# Patient Record
Sex: Female | Born: 1980 | Race: White | Hispanic: No | Marital: Married
Health system: Southern US, Community
[De-identification: ages and names within clinical notes are randomized; demographics above are authoritative.]

---

## 2010-11-30 ENCOUNTER — Ambulatory Visit: Payer: Self-pay | Admitting: Internal Medicine

## 2011-05-22 ENCOUNTER — Emergency Department: Payer: Self-pay | Admitting: Emergency Medicine

## 2012-03-12 IMAGING — CT CT ABD-PELV W/ CM
1 of 2 series · 15 of 32 positions shown, 19 images · non-contrast
Comparison: none

REASON FOR EXAM: (1) abd pain; (2) abd pain  h/o hysterctomy
COMMENTS:

[Series 2: 3mm soft tissue · axial · 0.69mm/px · z∈[-986,-569]mm · 15 of 153 slices shown, 19 images]
[im 7/153  soft-tissue]
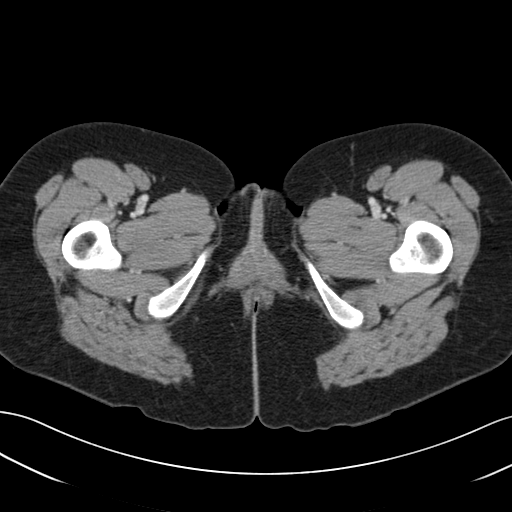
[im 7/153  bone]
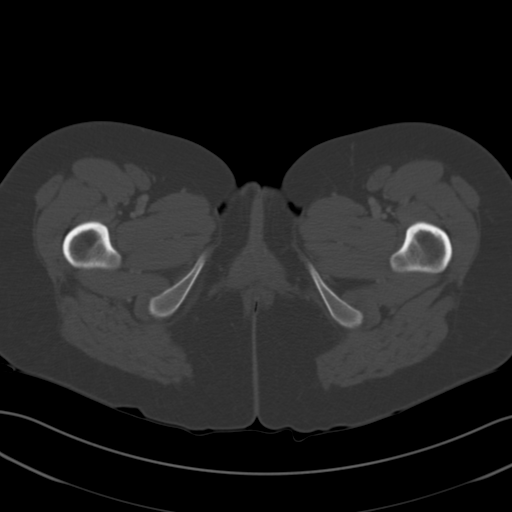
[im 20/153  soft-tissue]
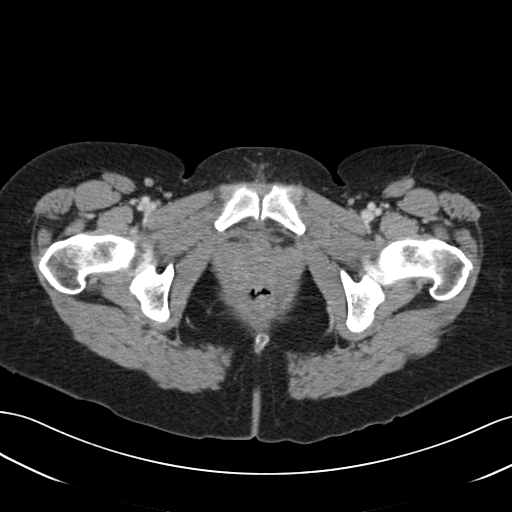
[im 32/153  soft-tissue]
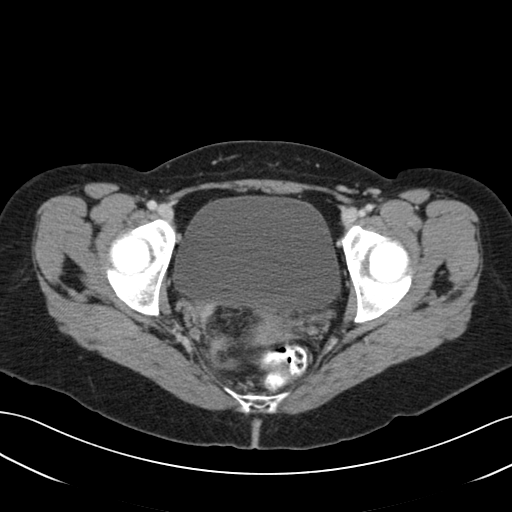
[im 45/153  soft-tissue]
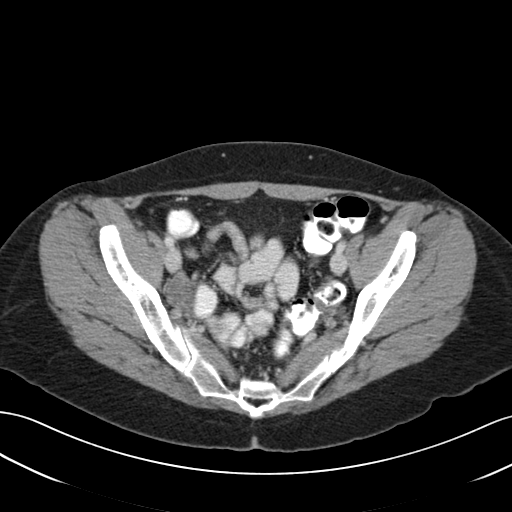
[im 51/153  soft-tissue]
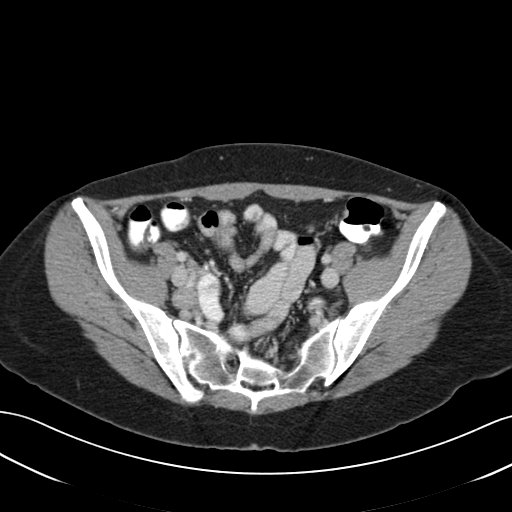
[im 64/153  soft-tissue]
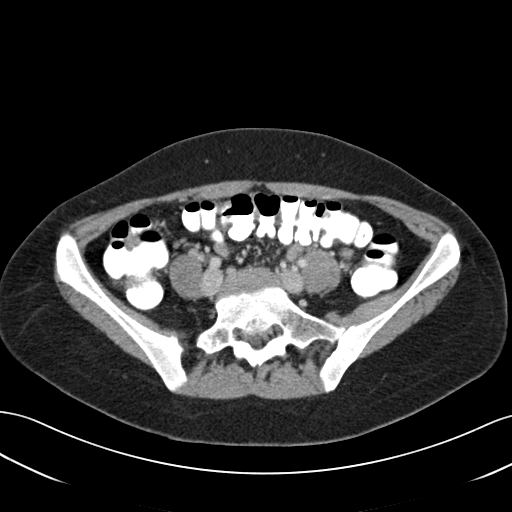
[im 77/153  soft-tissue]
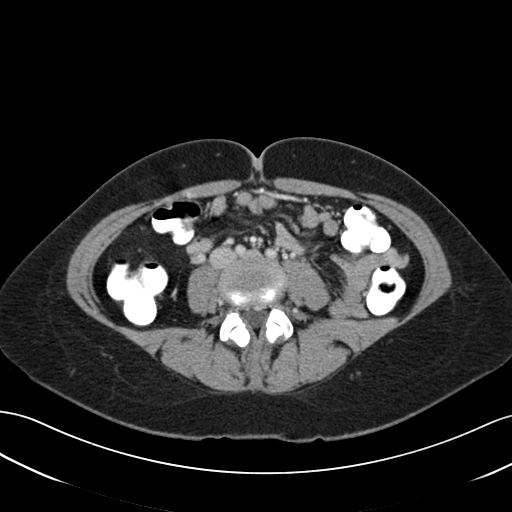
[im 89/153  soft-tissue]
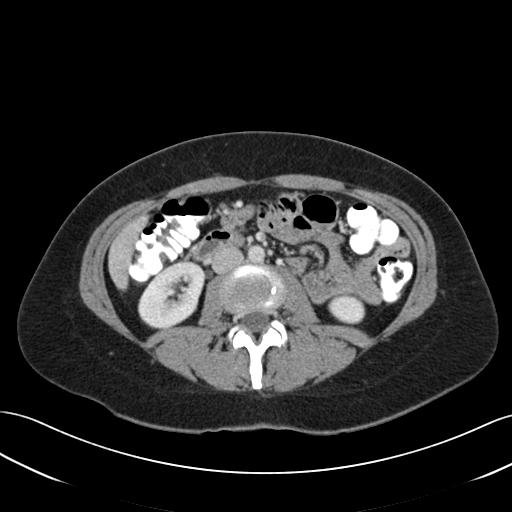
[im 102/153  soft-tissue]
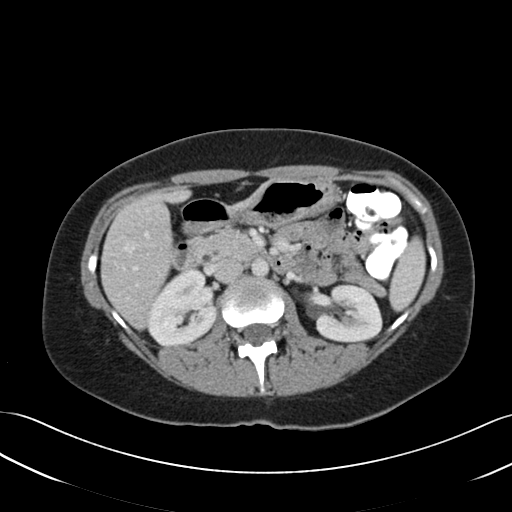
[im 102/153  bone]
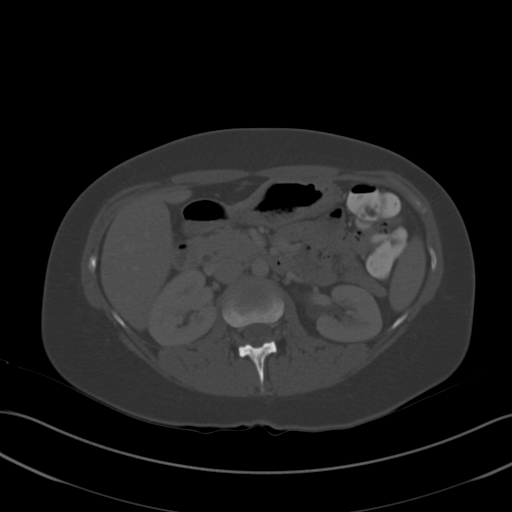
[im 108/153  soft-tissue]
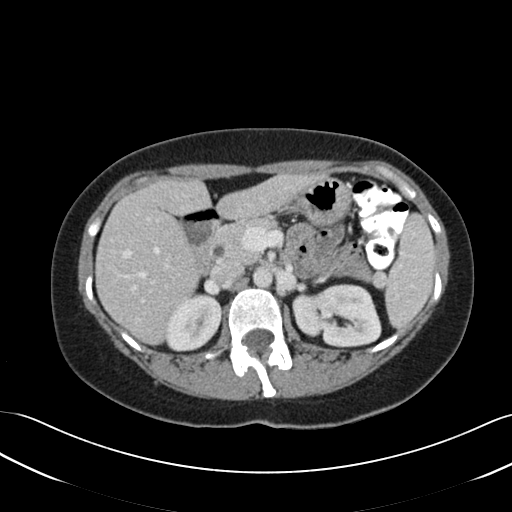
[im 121/153  soft-tissue]
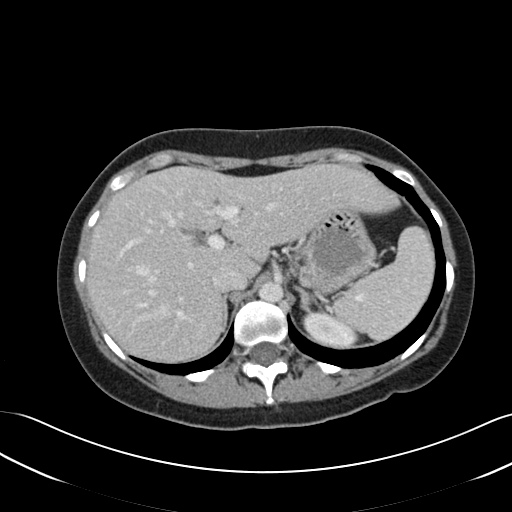
[im 127/153  lung]
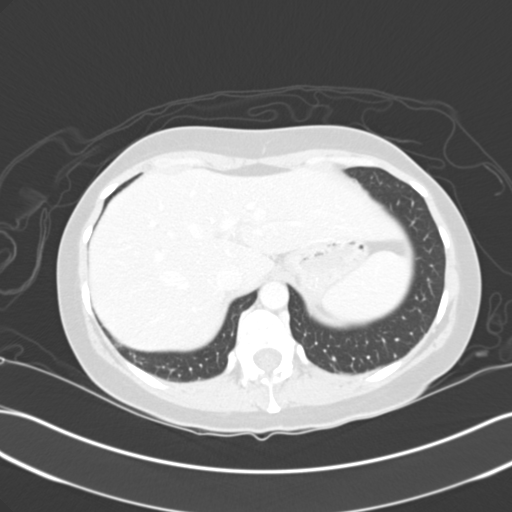
[im 134/153  soft-tissue]
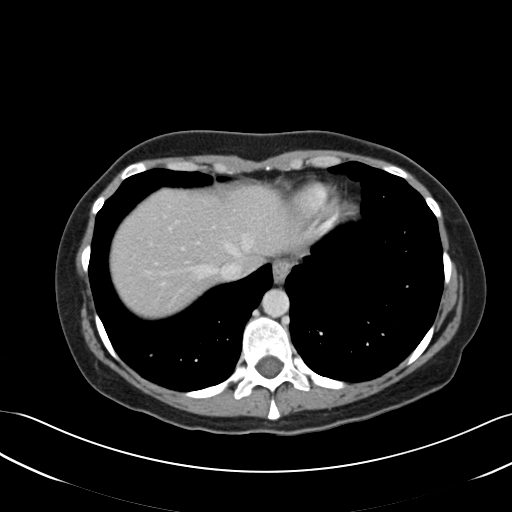
[im 134/153  lung]
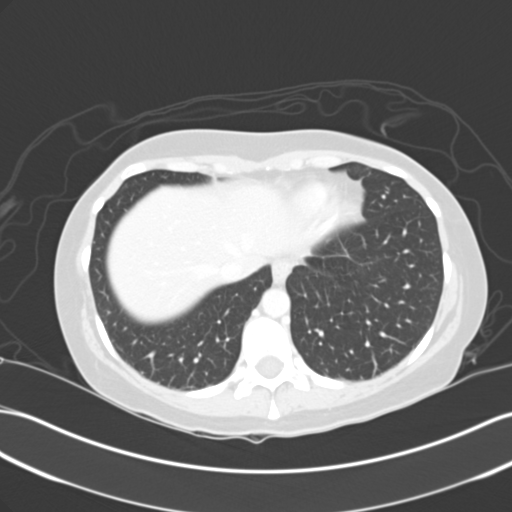
[im 140/153  lung]
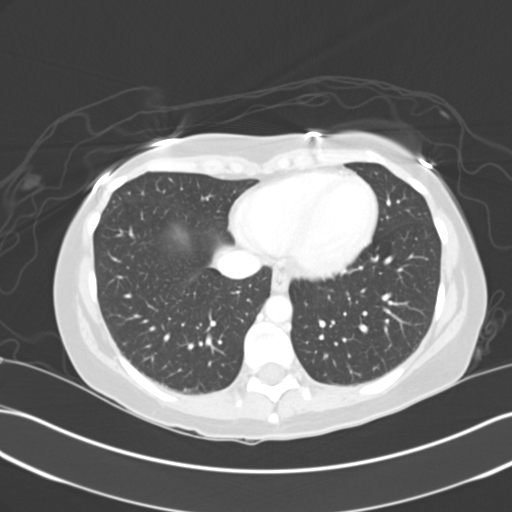
[im 146/153  soft-tissue]
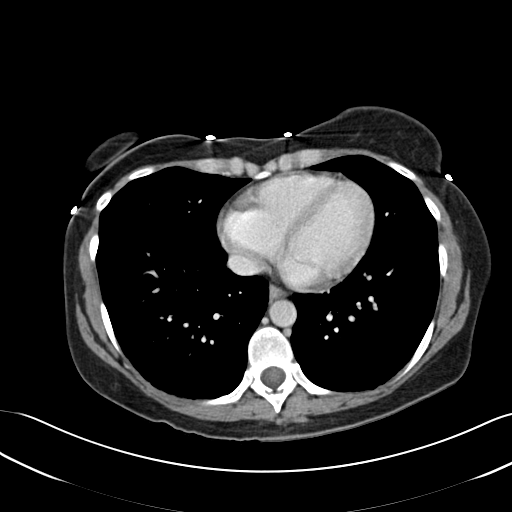
[im 146/153  lung]
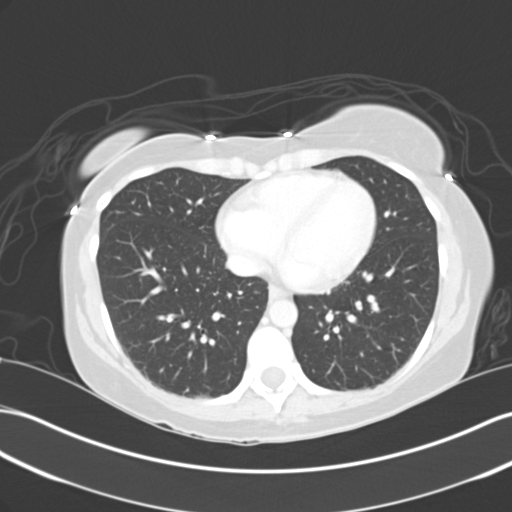

[15 of 32 positions shown; findings below may reference images not displayed]

PROCEDURE:     CT  - CT ABDOMEN / PELVIS  W  - May 22, 2011  [DATE]

RESULT:     CT of the abdomen and pelvis is performed with 85 mL of
[id number] iodinated intravenous contrast. Images are reconstructed at
mm slice thickness in the axial plane. There is no previous exam for
comparison.

Images through the base of the lungs demonstrate normal appearing aeration.
No pleural or pericardial effusion is evident. Cholecystectomy clips are
present. The liver, spleen, pancreas, kidneys and aorta appear unremarkable.
The adrenal glands show no definite mass or diffuse enlargement. The oral
contrast has passed into the colon to the rectum without evidence of
obstruction. A small amount remains in the distal small bowel. No adenopathy
is appreciated. No acute inflammation is evident area the appendix is
normal. Urinary bladder appears within normal limits. No adnexal mass is
appreciated.
IMPRESSION: 1. No acute abdominal or pelvic abnormality evident.

## 2014-03-18 ENCOUNTER — Ambulatory Visit: Payer: Self-pay | Admitting: Internal Medicine

## 2015-01-07 IMAGING — CR RIGHT FOOT COMPLETE - 3+ VIEW
1 series · 4 of 4 positions shown · non-contrast
Comparison: None.

CLINICAL DATA: Pain post trauma

EXAM:
RIGHT FOOT COMPLETE - 3+ VIEW

[Series 1: ap · 0.17mm/px · 4 of 4 slices shown]
[im 1/4]
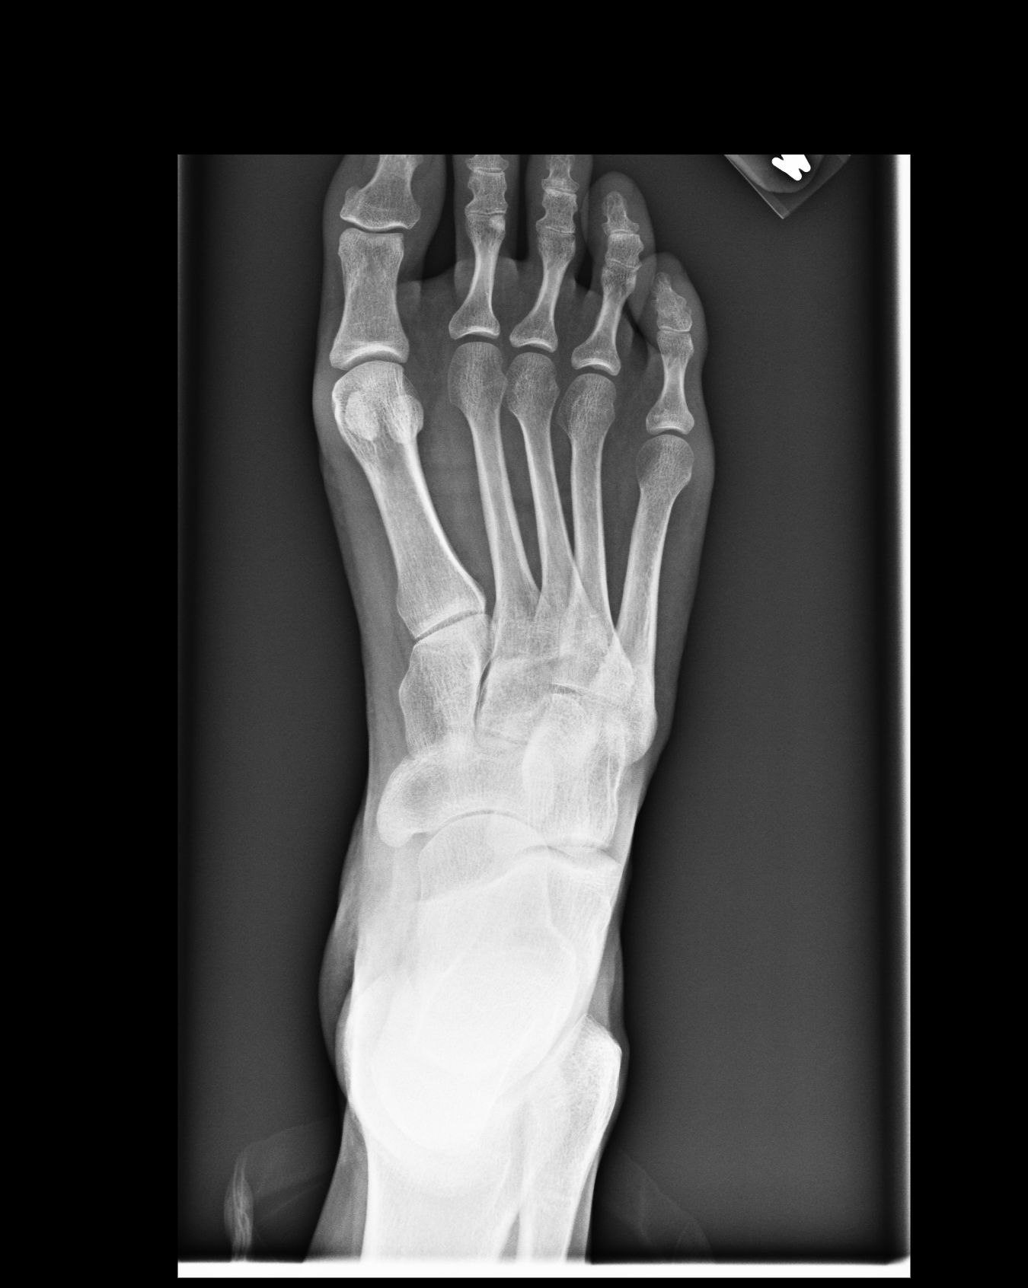
[im 2/4]
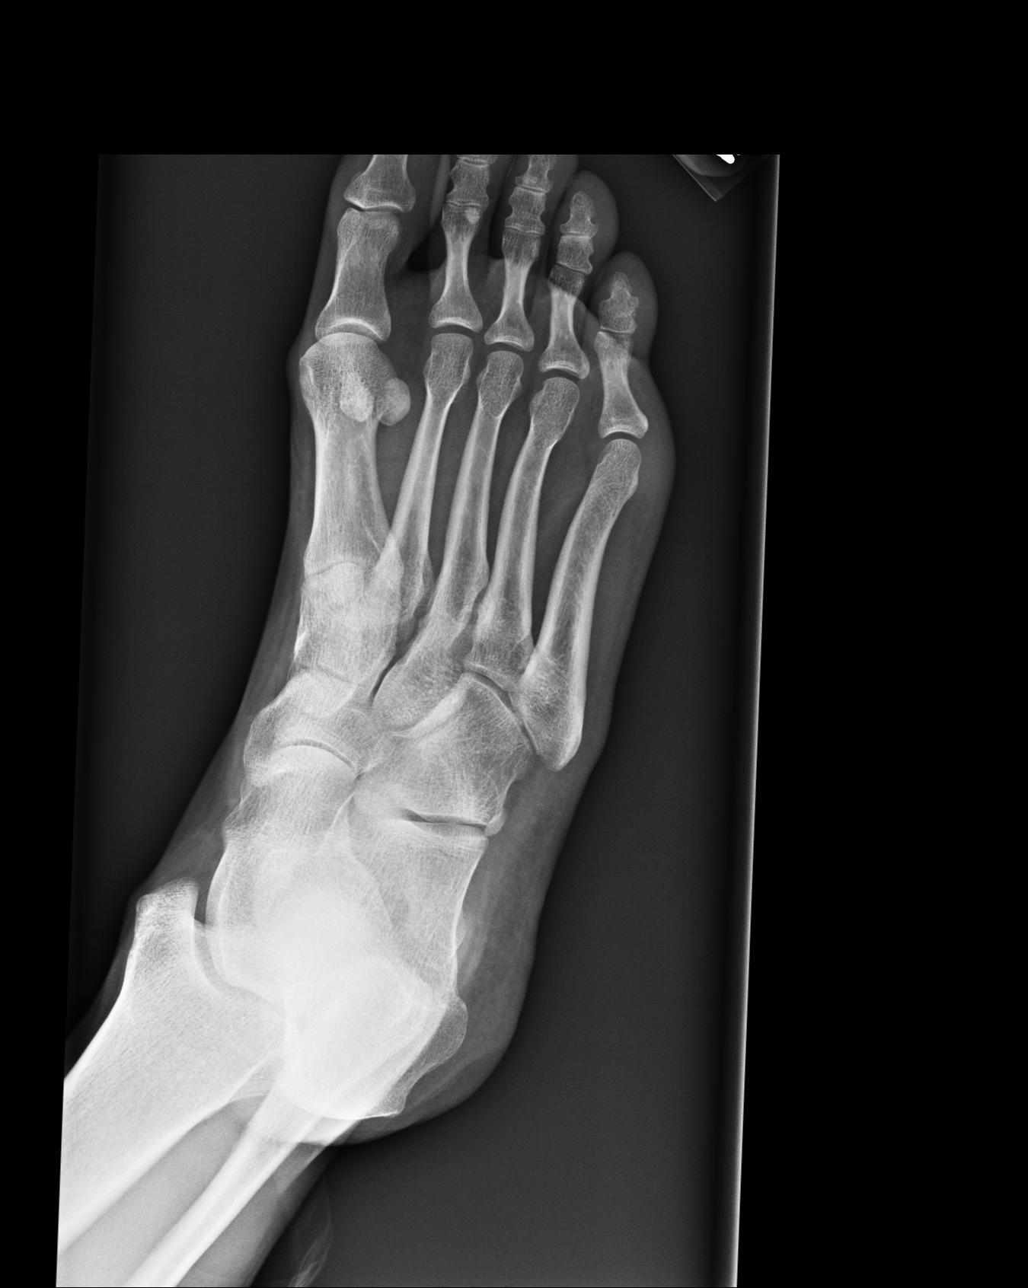
[im 3/4]
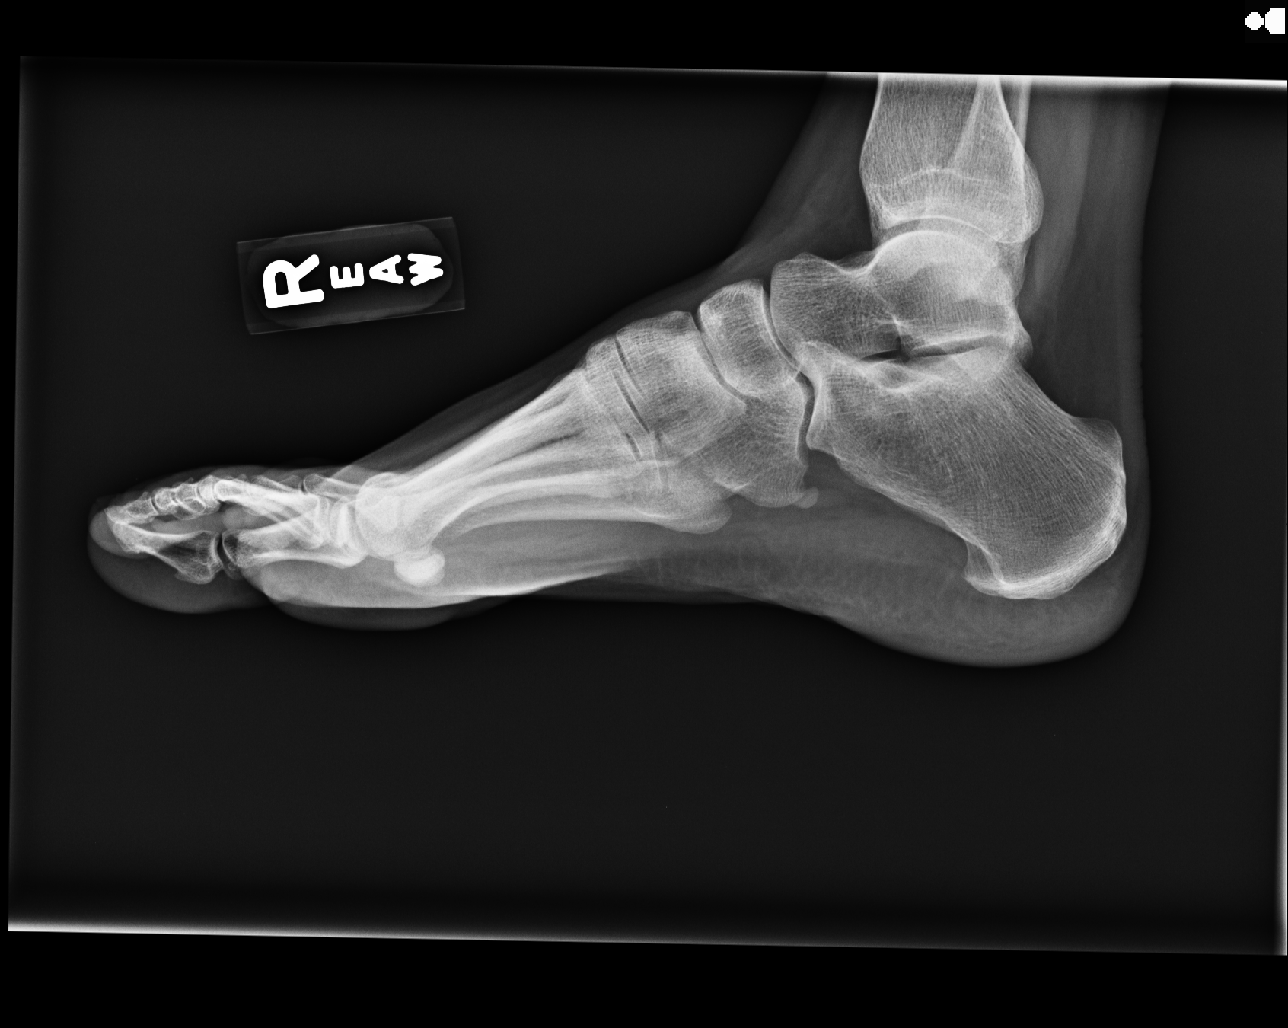
[im 4/4]
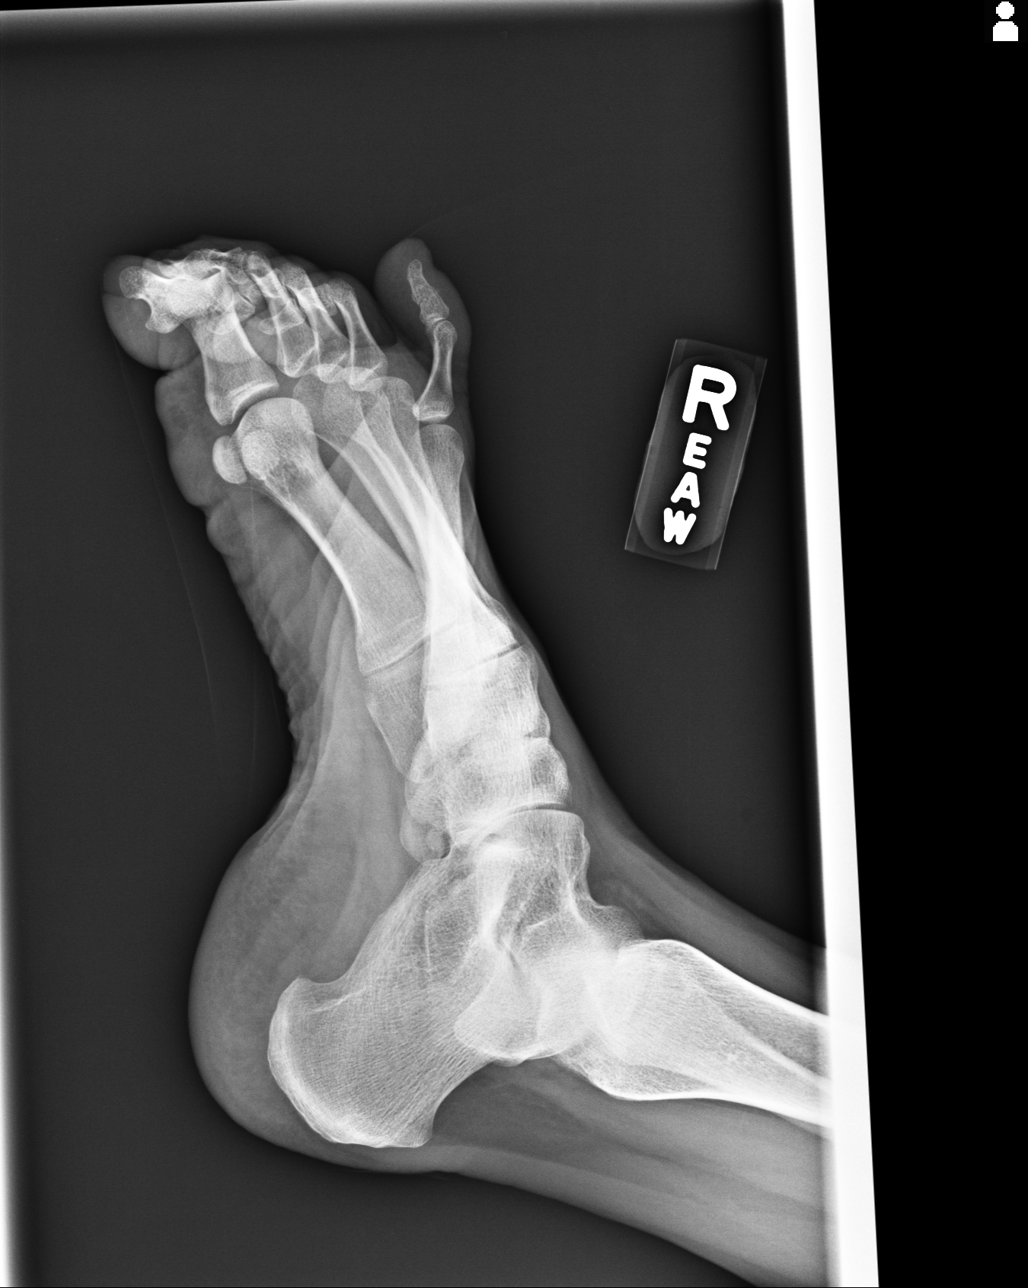

[4 of 4 positions shown; findings below may reference images not displayed]

FINDINGS: Frontal, oblique, and lateral views were obtained. There is a small
avulsion arising from the lateral aspect of the distal portion of
the fifth middle phalanx. No other fracture. No dislocation. There
is slight narrowing of all PIP and DIP joints. No erosive change.
There is a small bone island in the distal aspect of the second
proximal phalanx.
IMPRESSION: Subtle avulsion along the lateral distal aspect of the fifth middle
phalanx. No other fracture. No dislocation. Mild osteoarthritic
change in multiple distal joints.

## 2015-05-09 ENCOUNTER — Other Ambulatory Visit: Payer: Self-pay | Admitting: Internal Medicine

## 2015-10-30 ENCOUNTER — Other Ambulatory Visit: Payer: Self-pay | Admitting: Internal Medicine

## 2015-12-21 ENCOUNTER — Other Ambulatory Visit: Payer: Self-pay | Admitting: Internal Medicine

## 2016-01-02 NOTE — Telephone Encounter (Signed)
Left messages for pt to call the office, pt still has not return any calls.

## 2016-02-27 ENCOUNTER — Other Ambulatory Visit: Payer: Self-pay | Admitting: Internal Medicine
